# Patient Record
Sex: Male | Born: 1985 | Race: White | Hispanic: No | Marital: Single | Smoking: Never smoker
Health system: Southern US, Community
[De-identification: ages and names within clinical notes are randomized; demographics above are authoritative.]

## PROBLEM LIST (undated history)

## (undated) DIAGNOSIS — N289 Disorder of kidney and ureter, unspecified: Secondary | ICD-10-CM

---

## 2017-02-23 ENCOUNTER — Encounter: Payer: Self-pay | Admitting: Pulmonary Disease

## 2017-02-23 ENCOUNTER — Ambulatory Visit (INDEPENDENT_AMBULATORY_CARE_PROVIDER_SITE_OTHER): Payer: 59 | Admitting: Pulmonary Disease

## 2017-02-23 DIAGNOSIS — G4733 Obstructive sleep apnea (adult) (pediatric): Secondary | ICD-10-CM | POA: Diagnosis not present

## 2017-02-23 NOTE — Patient Instructions (Signed)
Home sleep study 

## 2017-02-23 NOTE — Assessment & Plan Note (Signed)
Given excessive daytime somnolence, narrow pharyngeal exam, witnessed apneas & loud snoring, obstructive sleep apnea is very likely & an overnight polysomnogram will be scheduled as a home study. The pathophysiology of obstructive sleep apnea , it's cardiovascular consequences & modes of treatment including CPAP were discused with the patient in detail & they evidenced understanding.  Pretest probability is high 

## 2017-02-23 NOTE — Addendum Note (Signed)
Addended by: Maurene Capes on: 02/23/2017 01:43 PM   Modules accepted: Orders

## 2017-02-23 NOTE — Progress Notes (Signed)
   Subjective:    Patient ID: Jason Burns, male    DOB: 1985/12/01, 31 y.o.   MRN: [id number]  HPI  31 year old obese man presents for evaluation of sleep-disordered breathing. 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 His boss noticed him getting sleepy on the job and asked him to seek evaluation. Epworth sleepiness score is 14 and he reports sleepiness while sitting and reading, watching TV or sitting inactive especially in the afternoons. He often comes home from work and naps for a few hours. Bedtime is between 11 PM and midnight, he cannot stay asleep continuously and wakes up every 2 hours for unclear reasons, loud snoring has been noted by family members and friends, he is known as "dragging" among his friends. Sleep latency is about 20 minutes, he sleeps on his side with 2 pillows, reports 3-4 nocturnal awakenings including nocturia 8 STEMI gets up and is out of bed at 5:30 AM feeling tired with occasional dryness of mouth. On weekends he stays in bed sometimes as late as 2 PM. There is no history suggestive of cataplexy, sleep paralysis or parasomnias  No bed partner history is available. He has been evaluated by primary care and diabetes was ruled out  No past medical history on file.  No past surgical history on file.  No Known Allergies   Social History   Social History  . Marital status: Single    Spouse name: N/A  . Number of children: N/A  . Years of education: N/A   Occupational History  . Not on file.   Social History Main Topics  . Smoking status: Never Smoker  . Smokeless tobacco: Never Used  . Alcohol use Yes  . Drug use: No  . Sexual activity: Not on file   Other Topics Concern  . Not on file   Social History Narrative  . No narrative on file     Family history of asthma in his mother  Review of Systems Constitutional: negative for anorexia, fevers and sweats  Eyes: negative for irritation, redness and visual  disturbance  Ears, nose, mouth, throat, and face: negative for earaches, epistaxis, nasal congestion and sore throat  Respiratory: negative for cough, dyspnea on exertion, sputum and wheezing  Cardiovascular: negative for chest pain, dyspnea, lower extremity edema, orthopnea, palpitations and syncope  Gastrointestinal: negative for abdominal pain, constipation, diarrhea, melena, nausea and vomiting  Genitourinary:negative for dysuria, frequency and hematuria  Hematologic/lymphatic: negative for bleeding, easy bruising and lymphadenopathy  Musculoskeletal:negative for arthralgias, muscle weakness and stiff joints  Neurological: negative for coordination problems, gait problems, headaches and weakness  Endocrine: negative for diabetic symptoms including polydipsia, polyuria and weight loss     Objective:   Physical Exam  Gen. Pleasant, obese, in no distress ENT -Class II airway, enlarged tonsils, no post nasal drip Neck: No JVD, no thyromegaly, no carotid bruits Lungs: no use of accessory muscles, no dullness to percussion, decreased without rales or rhonchi  Cardiovascular: Rhythm regular, heart sounds  normal, no murmurs or gallops, no peripheral edema Musculoskeletal: No deformities, no cyanosis or clubbing , no tremors       Assessment & Plan:

## 2017-02-28 ENCOUNTER — Telehealth: Payer: Self-pay | Admitting: Pulmonary Disease

## 2017-02-28 NOTE — Telephone Encounter (Signed)
Patient was seen by RA last Thursday in [place]. The order has been placed. PCCs will call the patient to get him scheduled.   Left message for patient to call back.

## 2017-03-01 NOTE — Telephone Encounter (Signed)
Spoke with pt, advised order will be placed. Nothing further is needed. Pt understood.

## 2017-03-08 DIAGNOSIS — G4733 Obstructive sleep apnea (adult) (pediatric): Secondary | ICD-10-CM | POA: Diagnosis not present

## 2017-03-16 ENCOUNTER — Telehealth: Payer: Self-pay | Admitting: Pulmonary Disease

## 2017-03-16 DIAGNOSIS — G4733 Obstructive sleep apnea (adult) (pediatric): Secondary | ICD-10-CM | POA: Diagnosis not present

## 2017-03-16 NOTE — Telephone Encounter (Signed)
Per RA, HST showed severe OSA. Suggests a CPAP titration ASAP as the next step.

## 2017-03-17 ENCOUNTER — Telehealth: Payer: Self-pay | Admitting: Pulmonary Disease

## 2017-03-17 DIAGNOSIS — G4733 Obstructive sleep apnea (adult) (pediatric): Secondary | ICD-10-CM

## 2017-03-17 NOTE — Telephone Encounter (Signed)
Per RA, HST showed severe OSA. Suggests a CPAP titration ASAP as the next step. Called and spoke with pt and he is aware of RA recs and the cpap titration study has been placed. Nothing further is needed.

## 2017-03-20 NOTE — Telephone Encounter (Signed)
Cordelia PenSherry called patient and gave patient appt for 04/27/17 and he was not aware and would like someone to explain this to him, CB is [phone].

## 2017-03-20 NOTE — Telephone Encounter (Signed)
Spoke with pt, explained the CPAP process and why he needed a CPAP titration. Pt understood and CPAP titration ordered. Nothing further is needed.

## 2017-03-20 NOTE — Addendum Note (Signed)
Addended by: Cydney OkAUGUSTIN, Anyla Israelson N on: 03/20/2017 09:44 AM   Modules accepted: Orders

## 2017-03-20 NOTE — Telephone Encounter (Signed)
See 03/17/17 message that is closed.  Jason Burns spoke with the patient only a few minutes ago but he did not remember having conversation with [name].

## 2017-03-21 ENCOUNTER — Other Ambulatory Visit: Payer: Self-pay | Admitting: *Deleted

## 2017-03-21 DIAGNOSIS — G4733 Obstructive sleep apnea (adult) (pediatric): Secondary | ICD-10-CM

## 2017-04-18 ENCOUNTER — Telehealth: Payer: Self-pay

## 2017-04-18 NOTE — Telephone Encounter (Signed)
Left message for patient to call back before placing order for CPAP machine.

## 2017-04-18 NOTE — Telephone Encounter (Signed)
-----   Message from Tobe SosSally E Ottinger sent at 04/05/2017  4:30 PM EST ----- Elvina SidleHey babe will you put these order in for this pt thanks  ----- Message ----- From: Oretha MilchAlva, Rakesh V, MD Sent: 04/04/2017   8:53 AM To: Tobe SosSally E Ottinger, Maurene Capesherina M Hilarie Sinha, CMA  Pl send Rx to DME for AutoCPAP 5-20 cm , mask of choice, humidity DL in 4 wks OV in 5 wks  [facility]   RA    ----- Message ----- From: Tobe Sosttinger, Sally E Sent: 04/03/2017   4:26 PM To: Oretha Milchakesh Alva V, MD  I just spoke to [facility] again and they have denied the cpap study  Do you want to do auto cpap? ----- Message ----- From: Oretha MilchAlva, Rakesh V, MD Sent: 04/03/2017  11:43 AM To: Pincus LargeSally E Ottinger, Bode Pieper M Vimal Derego, CMA  Are they denying?  RA ----- Message ----- From: Tobe Sosttinger, Sally E Sent: 04/03/2017  10:15 AM To: Oretha Milchakesh Alva V, MD, Maurene Capesherina M Keliyah Spillman, CMA  [facility] wants to know if you would consider a auto cpap versus a cpap titration study?

## 2017-04-27 ENCOUNTER — Ambulatory Visit (HOSPITAL_BASED_OUTPATIENT_CLINIC_OR_DEPARTMENT_OTHER): Payer: 59 | Attending: Pulmonary Disease | Admitting: Pulmonary Disease

## 2017-04-27 VITALS — Ht 70.0 in | Wt 330.0 lb

## 2017-04-27 DIAGNOSIS — G4733 Obstructive sleep apnea (adult) (pediatric): Secondary | ICD-10-CM | POA: Diagnosis present

## 2017-04-28 ENCOUNTER — Telehealth: Payer: Self-pay | Admitting: Pulmonary Disease

## 2017-04-28 NOTE — Procedures (Signed)
Patient Name: Jason Burns, Jason Burns Study Date: 04/27/2017 Gender: Male D.O.B: 06-Dec-1985 Age (years): 31 Referring Provider: Cyril Mourningakesh Brooklyne Radke Burns, Jason Burns Height (inches): 70 Interpreting Physician: Jason Burns, Jason Burns Weight (lbs): 330 RPSGT: Jason Burns, Jason Burns BMI: 47 MRN: [id number] Neck Size: 19.00   CLINICAL INFORMATION The patient is referred for a CPAP titration to treat sleep apnea.  Date of  HST: 02/2017 , severe OSA  SLEEP STUDY TECHNIQUE As per the AASM Manual for the Scoring of Sleep and Associated Events v2.3 (April 2016) with a hypopnea requiring 4% desaturations.  The channels recorded and monitored were frontal, central and occipital EEG, electrooculogram (EOG), submentalis EMG (chin), nasal and oral airflow, thoracic and abdominal wall motion, anterior tibialis EMG, snore microphone, electrocardiogram, and pulse oximetry. Continuous positive airway pressure (CPAP) was initiated at the beginning of the study and titrated to treat sleep-disordered breathing.  RESPIRATORY PARAMETERS Optimal PAP Pressure (cm): 17 AHI at Optimal Pressure (/hr): 3.4 Overall Minimal O2 (%): 73.00 Supine % at Optimal Pressure (%): 100 Minimal O2 at Optimal Pressure (%): 88.0     SLEEP ARCHITECTURE The study was initiated at 10:15:15 PM and ended at 5:29:19 AM.  Sleep onset time was 4.3 minutes and the sleep efficiency was 97.9%. The total sleep time was 425.0 minutes.  The patient spent 2.71% of the night in stage N1 sleep, 40.47% in stage N2 sleep, 24.82% in stage N3 and 32.00% in REM.Stage REM latency was 75.5 minutes  Wake after sleep onset was 4.8. Alpha intrusion was absent. Supine sleep was 100.00%.  CARDIAC DATA The 2 lead EKG demonstrated sinus rhythm. The mean heart rate was 80.40 beats per minute. Other EKG findings include: None.   LEG MOVEMENT DATA The total Periodic Limb Movements of Sleep (PLMS) were 0. The PLMS index was 0.00. A PLMS index of <15 is considered normal in  adults.  IMPRESSIONS - The optimal PAP pressure was 17 cm of water. - Central sleep apnea was not noted during this titration (CAI = 2.1/h). - Severe oxygen desaturations were observed during this titration (min O2 = 73.00%). - The patient snored with loud snoring volume during this titration study. - No cardiac abnormalities were observed during this study. - Clinically significant periodic limb movements were not noted during this study. Arousals associated with PLMs were rare.   DIAGNOSIS - Obstructive Sleep Apnea (327.23 [G47.33 ICD-10])   RECOMMENDATIONS - Trial of CPAP therapy on 17 cm H2O with a Medium size Fisher&Paykel Full Face Mask Simplus mask and heated humidification. - Avoid alcohol, sedatives and other CNS depressants that may worsen sleep apnea and disrupt normal sleep architecture. - Sleep hygiene should be reviewed to assess factors that may improve sleep quality. - Weight management and regular exercise should be initiated or continued. - Return to [facility] for re-evaluation after 4 weeks of therapy   Jason Mourningakesh Dariane Natzke Burns Board Certified in Sleep medicine

## 2017-04-28 NOTE — Telephone Encounter (Signed)
Rx for autoCPAP 10-18 cm H2O with a Medium size Fisher[employer]Paykel Full Face Mask Simplus mask and heated humidification. OV in 4 wks with TP/ me

## 2017-05-09 NOTE — Telephone Encounter (Signed)
Left message for patient to call back  

## 2017-05-10 ENCOUNTER — Telehealth: Payer: Self-pay | Admitting: Pulmonary Disease

## 2017-05-10 DIAGNOSIS — G4733 Obstructive sleep apnea (adult) (pediatric): Secondary | ICD-10-CM

## 2017-05-10 NOTE — Telephone Encounter (Signed)
See phone note dated 04/28/17  I have spoken with the pt and ordered CPAP per RA's request  He is aware to call for the ov once he obtains the machine  Nothing further needed

## 2017-05-10 NOTE — Telephone Encounter (Signed)
See phone note dated 05/10/17

## 2017-05-17 ENCOUNTER — Telehealth: Payer: Self-pay | Admitting: Pulmonary Disease

## 2017-05-17 DIAGNOSIS — G4733 Obstructive sleep apnea (adult) (pediatric): Secondary | ICD-10-CM

## 2017-05-17 NOTE — Telephone Encounter (Signed)
.  The order for CPAP was sent on 05/06/2017. He states he would like a printed prescription so he can come by and pick it up.

## 2017-05-18 NOTE — Telephone Encounter (Signed)
Pt advised Rx ready to pick up. Nothing further is needed.

## 2019-10-10 ENCOUNTER — Other Ambulatory Visit: Payer: Self-pay

## 2019-10-10 ENCOUNTER — Encounter (HOSPITAL_BASED_OUTPATIENT_CLINIC_OR_DEPARTMENT_OTHER): Payer: Self-pay | Admitting: *Deleted

## 2019-10-10 ENCOUNTER — Emergency Department (HOSPITAL_BASED_OUTPATIENT_CLINIC_OR_DEPARTMENT_OTHER)
Admission: EM | Admit: 2019-10-10 | Discharge: 2019-10-10 | Disposition: A | Discharge status: Home / Self Care | Payer: 59 | Attending: Emergency Medicine | Admitting: Emergency Medicine

## 2019-10-10 ENCOUNTER — Emergency Department (HOSPITAL_BASED_OUTPATIENT_CLINIC_OR_DEPARTMENT_OTHER): Payer: 59

## 2019-10-10 DIAGNOSIS — K8020 Calculus of gallbladder without cholecystitis without obstruction: Secondary | ICD-10-CM | POA: Diagnosis not present

## 2019-10-10 DIAGNOSIS — R1013 Epigastric pain: Secondary | ICD-10-CM | POA: Diagnosis present

## 2019-10-10 DIAGNOSIS — R112 Nausea with vomiting, unspecified: Secondary | ICD-10-CM | POA: Diagnosis not present

## 2019-10-10 DIAGNOSIS — N289 Disorder of kidney and ureter, unspecified: Secondary | ICD-10-CM | POA: Diagnosis not present

## 2019-10-10 HISTORY — DX: Disorder of kidney and ureter, unspecified: N28.9

## 2019-10-10 LAB — CBC WITH DIFFERENTIAL/PLATELET
Abs Immature Granulocytes: 0.05 10*3/uL (ref 0.00–0.07)
Basophils Absolute: 0 10*3/uL (ref 0.0–0.1)
Basophils Relative: 0 %
Eosinophils Absolute: 0.2 10*3/uL (ref 0.0–0.5)
Eosinophils Relative: 2 %
HCT: 45.3 % (ref 39.0–52.0)
Hemoglobin: 15.4 g/dL (ref 13.0–17.0)
Immature Granulocytes: 1 %
Lymphocytes Relative: 23 %
Lymphs Abs: 2.2 10*3/uL (ref 0.7–4.0)
MCH: 30.3 pg (ref 26.0–34.0)
MCHC: 34 g/dL (ref 30.0–36.0)
MCV: 89.2 fL (ref 80.0–100.0)
Monocytes Absolute: 0.8 10*3/uL (ref 0.1–1.0)
Monocytes Relative: 8 %
Neutro Abs: 6.2 10*3/uL (ref 1.7–7.7)
Neutrophils Relative %: 66 %
Platelets: 179 10*3/uL (ref 150–400)
RBC: 5.08 MIL/uL (ref 4.22–5.81)
RDW: 13.8 % (ref 11.5–15.5)
WBC: 9.4 10*3/uL (ref 4.0–10.5)
nRBC: 0 % (ref 0.0–0.2)

## 2019-10-10 LAB — COMPREHENSIVE METABOLIC PANEL
ALT: 43 U/L (ref 0–44)
AST: 26 U/L (ref 15–41)
Albumin: 4.4 g/dL (ref 3.5–5.0)
Alkaline Phosphatase: 56 U/L (ref 38–126)
Anion gap: 11 (ref 5–15)
BUN: 11 mg/dL (ref 6–20)
CO2: 28 mmol/L (ref 22–32)
Calcium: 9.4 mg/dL (ref 8.9–10.3)
Chloride: 102 mmol/L (ref 98–111)
Creatinine, Ser: 0.76 mg/dL (ref 0.61–1.24)
GFR calc Af Amer: >60 mL/min (ref >60)
GFR calc non Af Amer: >60 mL/min (ref >60)
Glucose, Bld: 147 mg/dL — ABNORMAL HIGH (ref 70–99)
Potassium: 3.9 mmol/L (ref 3.5–5.1)
Sodium: 141 mmol/L (ref 135–145)
Total Bilirubin: 0.7 mg/dL (ref 0.3–1.2)
Total Protein: 8 g/dL (ref 6.5–8.1)

## 2019-10-10 LAB — URINALYSIS, ROUTINE W REFLEX MICROSCOPIC
Bilirubin Urine: NEGATIVE
Glucose, UA: NEGATIVE mg/dL
Ketones, ur: NEGATIVE mg/dL
Leukocytes,Ua: NEGATIVE
Nitrite: NEGATIVE
Protein, ur: NEGATIVE mg/dL
Specific Gravity, Urine: >1.03 — ABNORMAL HIGH (ref 1.005–1.030)
pH: 6 (ref 5.0–8.0)

## 2019-10-10 LAB — LIPASE, BLOOD: Lipase: 36 U/L (ref 11–51)

## 2019-10-10 LAB — URINALYSIS, MICROSCOPIC (REFLEX)

## 2019-10-10 MED ORDER — ONDANSETRON 8 MG PO TBDP: 8.0000 mg | ORAL_TABLET | Freq: Three times a day (TID) | ORAL | 0 refills | Status: AC | PRN

## 2019-10-10 MED ORDER — SUCRALFATE 1 GM/10ML PO SUSP
1.0000 g | Freq: Once | ORAL | Status: AC
  Administered 2019-10-10: 1 g via ORAL
  Filled 2019-10-10: qty 10

## 2019-10-10 MED ORDER — IOHEXOL 300 MG/ML  SOLN
100.0000 mL | Freq: Once | INTRAMUSCULAR | Status: AC | PRN
  Administered 2019-10-10: 100 mL via INTRAVENOUS

## 2019-10-10 MED ORDER — PANTOPRAZOLE SODIUM 40 MG IV SOLR
40.0000 mg | Freq: Once | INTRAVENOUS | Status: AC
  Administered 2019-10-10: 40 mg via INTRAVENOUS
  Filled 2019-10-10: qty 40

## 2019-10-10 MED ORDER — SUCRALFATE 1 G PO TABS
1.0000 g | ORAL_TABLET | Freq: Once | ORAL | Status: DC
  Filled 2019-10-10: qty 1

## 2019-10-10 MED ORDER — OMEPRAZOLE 20 MG PO CPDR: 20.0000 mg | DELAYED_RELEASE_CAPSULE | Freq: Every day | ORAL | 0 refills | Status: AC

## 2019-10-10 MED ORDER — ONDANSETRON HCL 4 MG/2ML IJ SOLN
4.0000 mg | Freq: Once | INTRAMUSCULAR | Status: AC
  Administered 2019-10-10: 4 mg via INTRAVENOUS
  Filled 2019-10-10: qty 2

## 2019-10-10 MED ORDER — SODIUM CHLORIDE 0.9 % IV BOLUS
1000.0000 mL | Freq: Once | INTRAVENOUS | Status: AC
  Administered 2019-10-10: 1000 mL via INTRAVENOUS

## 2019-10-10 MED ORDER — FENTANYL CITRATE (PF) 100 MCG/2ML IJ SOLN
100.0000 ug | Freq: Once | INTRAMUSCULAR | Status: AC
  Administered 2019-10-10: 100 ug via INTRAVENOUS
  Filled 2019-10-10: qty 2

## 2019-10-10 NOTE — ED Triage Notes (Signed)
Pt c/o epigastric pain and vomited x 2 episodes  x 1 day

## 2019-10-10 NOTE — ED Provider Notes (Addendum)
He rates  [facility] Provider Note: Lowella Dell, MD, FACEP  CSN: [id number] MRN: [id number] ARRIVAL: 10/10/19 at 0105 ROOM: [facility]   CHIEF COMPLAINT  Abdominal Pain   HISTORY OF PRESENT ILLNESS  10/10/19 4:50 AM Jason Burns is a 33 y.o. male who complains of epigastric pain since about 10 PM yesterday evening.  The pain is a 7 out of 10 ("2 notches down from a kidney stone") with associated nausea and vomiting.  Pain is worse with palpation.  He has had no bowel movement since this began.  He has not had a fever.  The pain is sharp in nature.   Past Medical History:  Diagnosis Date  . Renal disorder     History reviewed. No pertinent surgical history.  No family history on file.  Social History   Tobacco Use  . Smoking status: Never Smoker  . Smokeless tobacco: Never Used  Substance Use Topics  . Alcohol use: Yes  . Drug use: No    Prior to Admission medications   Medication Sig Start Date End Date Taking? Authorizing Provider  omeprazole (PRILOSEC) 20 MG capsule Take 1 capsule (20 mg total) by mouth daily. 10/10/19   Mariyam Remington, Jonny Ruiz, MD  ondansetron (ZOFRAN ODT) 8 MG disintegrating tablet Take 1 tablet (8 mg total) by mouth every 8 (eight) hours as needed for nausea or vomiting. 10/10/19   Braxtyn Bojarski, Jonny Ruiz, MD    Allergies Patient has no known allergies.   REVIEW OF SYSTEMS  Negative except as noted here or in the History of Present Illness.   PHYSICAL EXAMINATION  Initial Vital Signs Blood pressure 139/84, pulse 65, temperature 98.6 F (37 C), temperature source Oral, resp. rate 18, height 5\' 10"  (1.778 m), SpO2 100 %.  Examination General: Well-developed, obese male in no acute distress; appearance consistent with age of record HENT: normocephalic; atraumatic Eyes: pupils equal, round and reactive to light; extraocular muscles intact Neck: supple Heart: regular rate and rhythm Lungs: clear to auscultation bilaterally Abdomen: soft;  nondistended; epigastric tenderness; bowel sounds present Extremities: No deformity; full range of motion; edema of lower legs Neurologic: Awake, alert and oriented; motor function intact in all extremities and symmetric; no facial droop Skin: Warm and dry Psychiatric: Normal mood and affect   RESULTS  Summary of this visit's results, reviewed and interpreted by myself:   EKG Interpretation  Date/Time:    Ventricular Rate:    PR Interval:    QRS Duration:   QT Interval:    QTC Calculation:   R Axis:     Text Interpretation:        Laboratory Studies: Results for orders placed or performed during the hospital encounter of 10/10/19 (from the past 24 hour(s))  CBC with Differential     Status: None   Collection Time: 10/10/19  1:20 AM  Result Value Ref Range   WBC 9.4 4.0 - 10.5 K/uL   RBC 5.08 4.22 - 5.81 MIL/uL   Hemoglobin 15.4 13.0 - 17.0 g/dL   HCT 10/12/19 79.0 - 24.0 %   MCV 89.2 80.0 - 100.0 fL   MCH 30.3 26.0 - 34.0 pg   MCHC 34.0 30.0 - 36.0 g/dL   RDW 97.3 53.2 - 99.2 %   Platelets 179 150 - 400 K/uL   nRBC 0.0 0.0 - 0.2 %   Neutrophils Relative % 66 %   Neutro Abs 6.2 1.7 - 7.7 K/uL   Lymphocytes Relative 23 %   Lymphs Abs 2.2 0.7 -  4.0 K/uL   Monocytes Relative 8 %   Monocytes Absolute 0.8 0.1 - 1.0 K/uL   Eosinophils Relative 2 %   Eosinophils Absolute 0.2 0.0 - 0.5 K/uL   Basophils Relative 0 %   Basophils Absolute 0.0 0.0 - 0.1 K/uL   Immature Granulocytes 1 %   Abs Immature Granulocytes 0.05 0.00 - 0.07 K/uL  Comprehensive metabolic panel     Status: Abnormal   Collection Time: 10/10/19  1:20 AM  Result Value Ref Range   Sodium 141 135 - 145 mmol/L   Potassium 3.9 3.5 - 5.1 mmol/L   Chloride 102 98 - 111 mmol/L   CO2 28 22 - 32 mmol/L   Glucose, Bld 147 (H) 70 - 99 mg/dL   BUN 11 6 - 20 mg/dL   Creatinine, Ser 0.76 0.61 - 1.24 mg/dL   Calcium 9.4 8.9 - 10.3 mg/dL   Total Protein 8.0 6.5 - 8.1 g/dL   Albumin 4.4 3.5 - 5.0 g/dL   AST 26 15 - 41  U/L   ALT 43 0 - 44 U/L   Alkaline Phosphatase 56 38 - 126 U/L   Total Bilirubin 0.7 0.3 - 1.2 mg/dL   GFR calc non Af Amer >60 >60 mL/min   GFR calc Af Amer >60 >60 mL/min   Anion gap 11 5 - 15  Lipase, blood     Status: None   Collection Time: 10/10/19  1:20 AM  Result Value Ref Range   Lipase 36 11 - 51 U/L  Urinalysis, Routine w reflex microscopic     Status: Abnormal   Collection Time: 10/10/19  2:30 AM  Result Value Ref Range   Color, Urine YELLOW YELLOW   APPearance HAZY (A) CLEAR   Specific Gravity, Urine >1.030 (H) 1.005 - 1.030   pH 6.0 5.0 - 8.0   Glucose, UA NEGATIVE NEGATIVE mg/dL   Hgb urine dipstick MODERATE (A) NEGATIVE   Bilirubin Urine NEGATIVE NEGATIVE   Ketones, ur NEGATIVE NEGATIVE mg/dL   Protein, ur NEGATIVE NEGATIVE mg/dL   Nitrite NEGATIVE NEGATIVE   Leukocytes,Ua NEGATIVE NEGATIVE  Urinalysis, Microscopic (reflex)     Status: Abnormal   Collection Time: 10/10/19  2:30 AM  Result Value Ref Range   RBC / HPF 0-5 0 - 5 RBC/hpf   WBC, UA 6-10 0 - 5 WBC/hpf   Bacteria, UA FEW (A) NONE SEEN   Squamous Epithelial / LPF 0-5 0 - 5   WBC Clumps PRESENT    Imaging Studies: CT ABDOMEN PELVIS W CONTRAST  Result Date: 10/10/2019 CLINICAL DATA:  Epigastric abdominal pain. EXAM: CT ABDOMEN AND PELVIS WITH CONTRAST TECHNIQUE: Multidetector CT imaging of the abdomen and pelvis was performed using the standard protocol following bolus administration of intravenous contrast. CONTRAST:  140mL OMNIPAQUE IOHEXOL 300 MG/ML  SOLN COMPARISON:  None. FINDINGS: Lower chest: The lung bases are clear of acute process. No pleural effusion or pulmonary lesions. The heart is normal in size. No pericardial effusion. The distal esophagus and aorta are unremarkable. Hepatobiliary: No hepatic lesions are identified. Suspect mild diffuse fatty infiltration of the liver. The portal and hepatic veins are patent. Several small dependent calcified gallstones are noted the gallbladder. No  findings for acute cholecystitis. No common bile duct dilatation. Pancreas: No mass, inflammation or ductal dilatation. Spleen: Upper limits of normal in size. No focal lesions. Adrenals/Urinary Tract: The adrenal glands and kidneys are normal. No renal, ureteral or bladder calculi or mass. Stomach/Bowel: The stomach, duodenum, small bowel  and colon are grossly normal without oral contrast. No acute inflammatory changes, mass lesions or obstructive findings. The terminal ileum is normal. The appendix is unremarkable. Vascular/Lymphatic: The aorta is normal in caliber. No dissection. The branch vessels are patent. The major venous structures are patent. No mesenteric or retroperitoneal mass or adenopathy. Small scattered lymph nodes are noted. Reproductive: The prostate gland and seminal vesicles are unremarkable. Other: No pelvic mass or adenopathy. No free pelvic fluid collections. No inguinal mass or adenopathy. No abdominal wall hernia or subcutaneous lesions. Musculoskeletal: No significant bony findings. IMPRESSION: 1. No acute abdominal/pelvic findings, mass lesions or adenopathy. 2. Cholelithiasis but no CT findings for acute cholecystitis. 3. Suspect mild diffuse fatty infiltration of the liver. Electronically Signed   By: Rudie Meyer M.D.   On: 10/10/2019 06:01    ED COURSE and MDM  Nursing notes, initial and subsequent vitals signs, including pulse oximetry, reviewed and interpreted by myself.  Vitals:   10/10/19 0111 10/10/19 0113 10/10/19 0328 10/10/19 0631  BP:  (!) 151/100 139/84 (!) 149/84  Pulse:  75 65 81  Resp:  18 18 16   Temp:  98.6 F (37 C)    TempSrc:  Oral    SpO2:  100% 100% 100%  Height: 5\' 10"  (1.778 m)      Medications  sucralfate (CARAFATE) tablet 1 g (has no administration in time range)  sodium chloride 0.9 % bolus 1,000 mL (1,000 mLs Intravenous New Bag/Given 10/10/19 0508)  ondansetron (ZOFRAN) injection 4 mg (4 mg Intravenous Given 10/10/19 0508)  pantoprazole  (PROTONIX) injection 40 mg (40 mg Intravenous Given 10/10/19 0513)  fentaNYL (SUBLIMAZE) injection 100 mcg (100 mcg Intravenous Given 10/10/19 0513)  iohexol (OMNIPAQUE) 300 MG/ML solution 100 mL (100 mLs Intravenous Contrast Given 10/10/19 0534)   6:28 AM Patient's abdomen is less tender at this time and his pain has migrated more to the left.  His pain and nausea are improved.  I suspect this represents acute gastritis possibly of viral etiology.  He was advised of his numerous gallstones but his pain was located medial to his gallbladder and he had a negative Murphy sign.  He was advised he may wish to follow-up with general surgery regarding elective cholecystectomy in the future.   PROCEDURES  Procedures   ED DIAGNOSES     ICD-10-CM   1. Epigastric pain  R10.13   2. Nausea and vomiting in adult  R11.2   3. Calculus of gallbladder without cholecystitis without obstruction  K80.20        Orell Hurtado, MD 10/10/19 10/12/19    10/12/19, MD 10/10/19 2237

## 2020-12-07 IMAGING — CT CT ABD-PELV W/ CM
2 of 4 series · 16 of 46 positions shown, 18 images · IV contrast (Omnipaque)
Comparison: None.

CLINICAL DATA: Epigastric abdominal pain.

EXAM:
CT ABDOMEN AND PELVIS WITH CONTRAST
TECHNIQUE: Multidetector CT imaging of the abdomen and pelvis was performed
using the standard protocol following bolus administration of
intravenous contrast.
CONTRAST:  100mL OMNIPAQUE IOHEXOL 300 MG/ML  SOLN

[Series 2: axial st · axial · 0.98mm/px · z∈[-540,-30]mm · 13 of 112 slices shown, 15 images]
[im 5/112  soft-tissue]
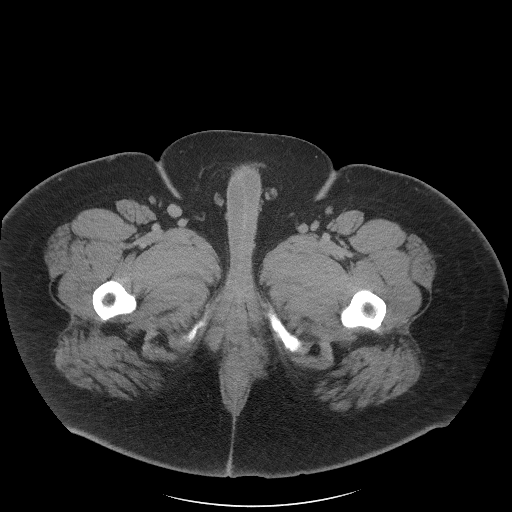
[im 5/112  bone]
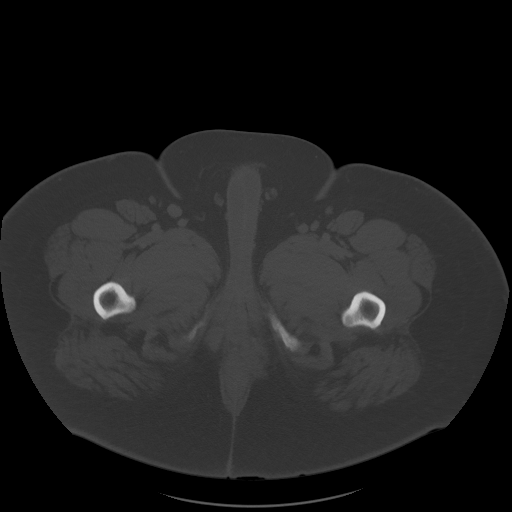
[im 15/112  soft-tissue]
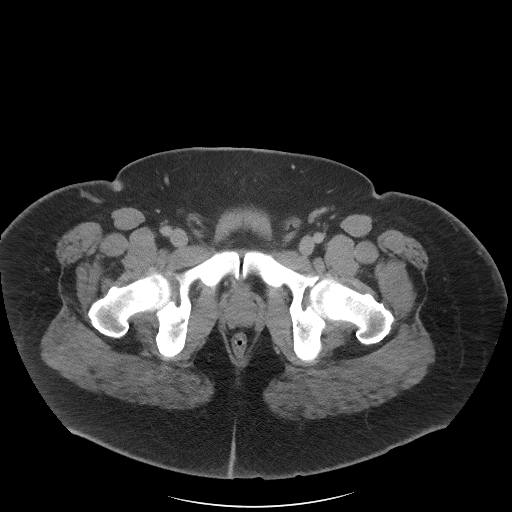
[im 25/112  soft-tissue]
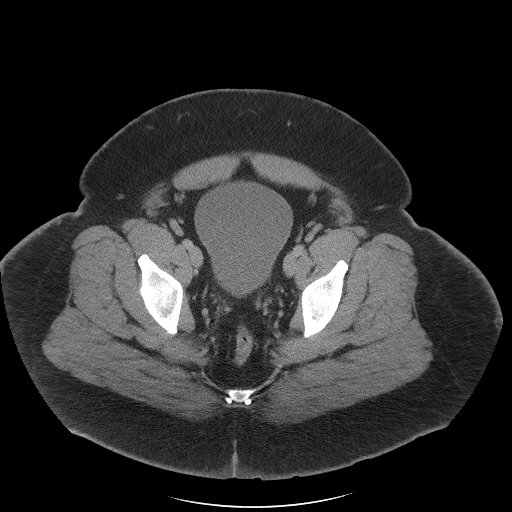
[im 29/112  soft-tissue]
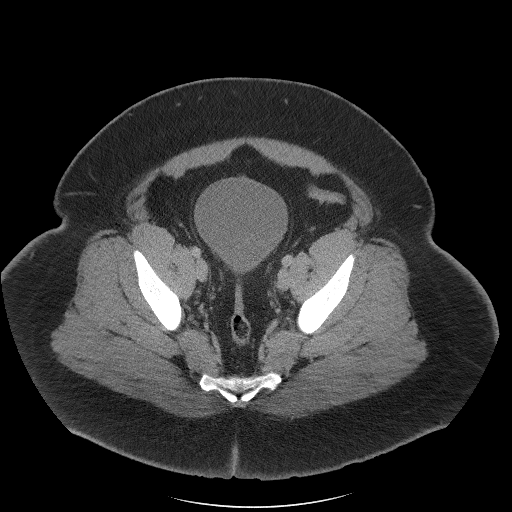
[im 39/112  soft-tissue]
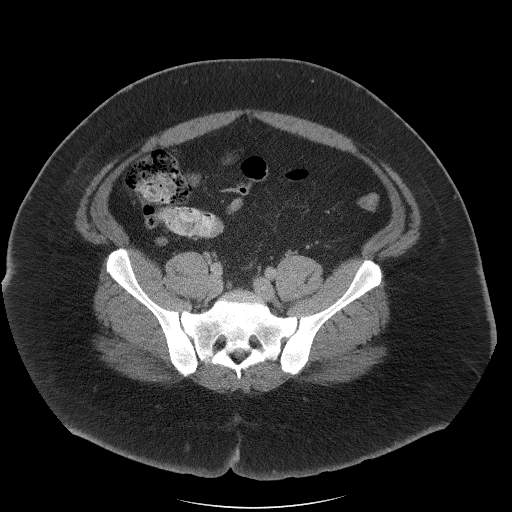
[im 49/112  soft-tissue]
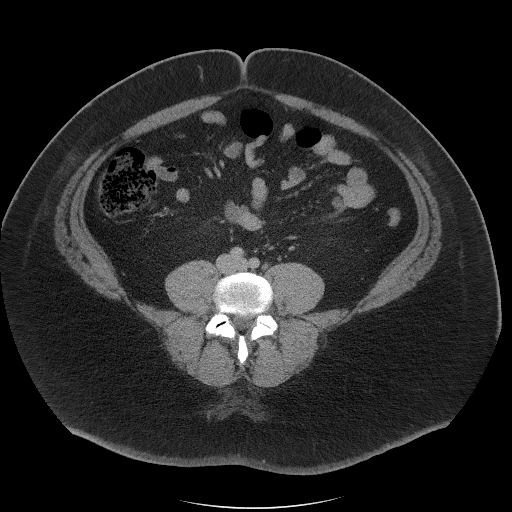
[im 58/112  soft-tissue]
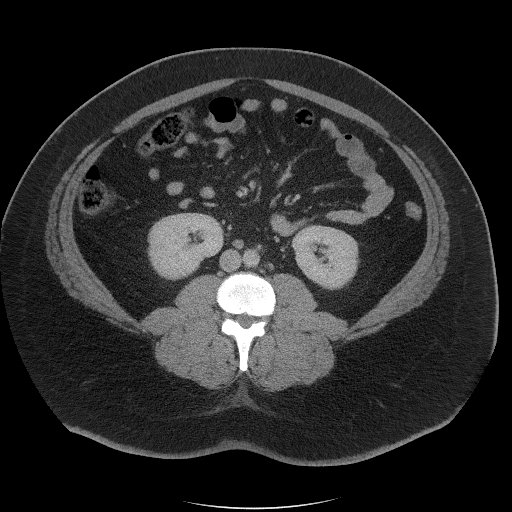
[im 63/112  soft-tissue]
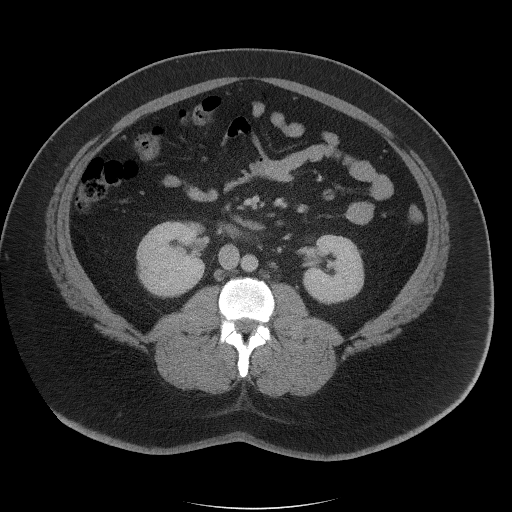
[im 73/112  soft-tissue]
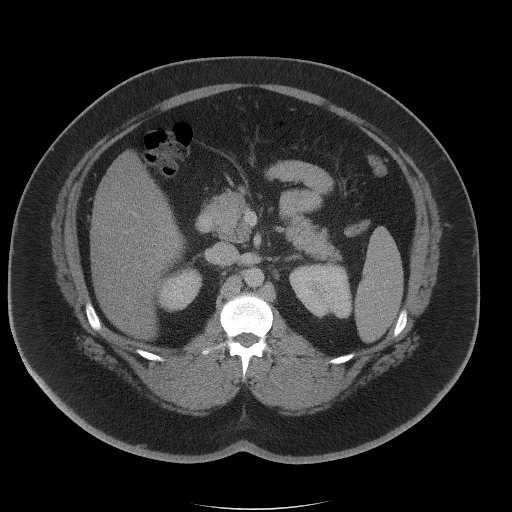
[im 73/112  bone]
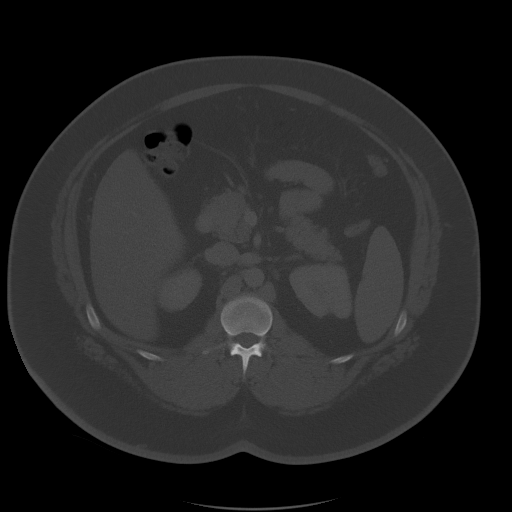
[im 83/112  soft-tissue]
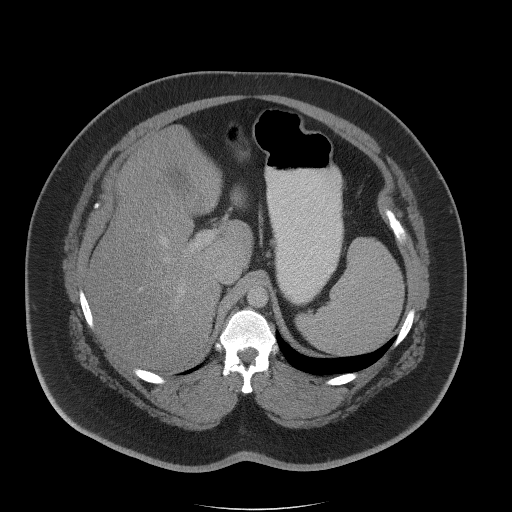
[im 87/112  soft-tissue]
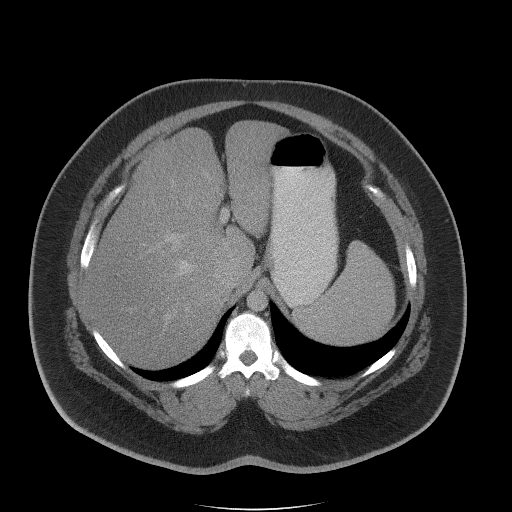
[im 97/112  soft-tissue]
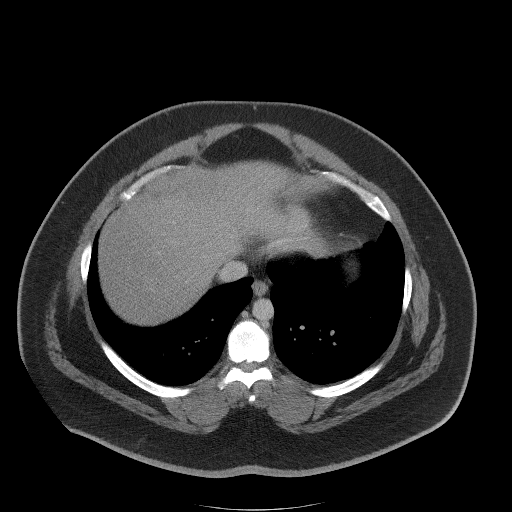
[im 107/112  soft-tissue]
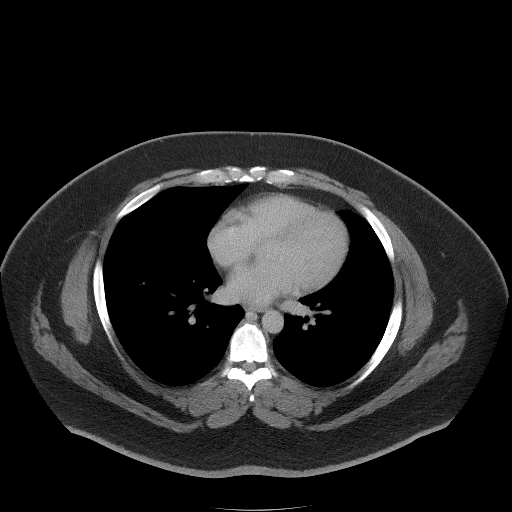

[Series 5: coronal st · coronal · 1.00mm/px · 3 of 118 slices shown]
[im 40/118  soft-tissue]
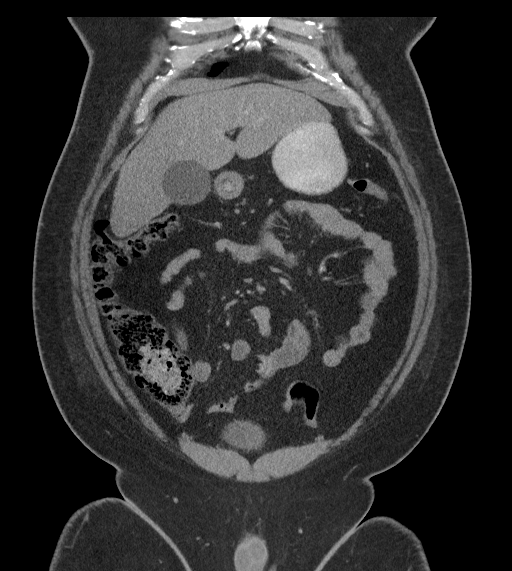
[im 53/118  soft-tissue]
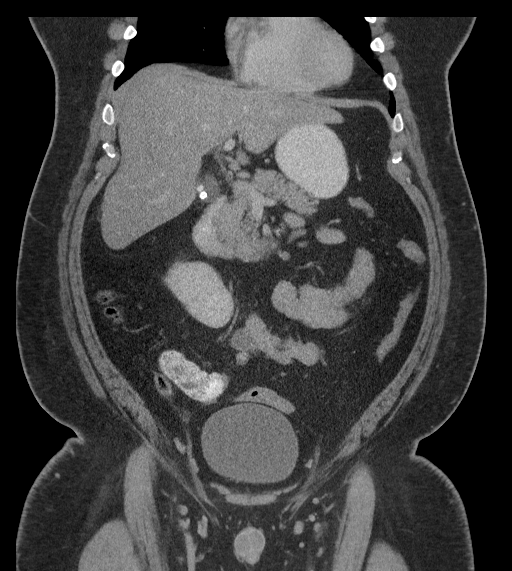
[im 66/118  soft-tissue]
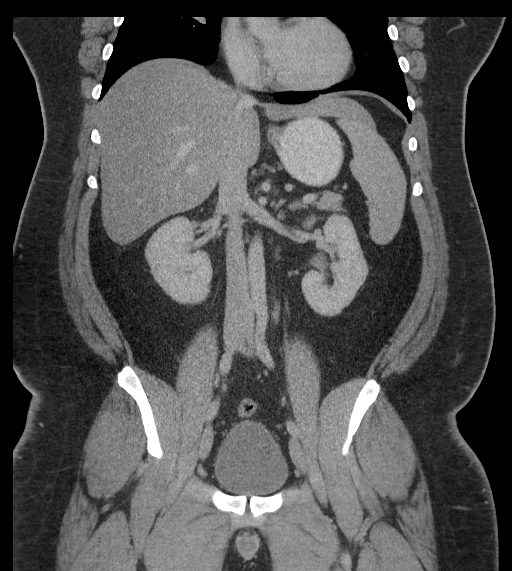

[16 of 46 positions shown; findings below may reference images not displayed]

FINDINGS: Lower chest: The lung bases are clear of acute process. No pleural
effusion or pulmonary lesions. The heart is normal in size. No
pericardial effusion. The distal esophagus and aorta are
unremarkable.

Hepatobiliary: No hepatic lesions are identified. Suspect mild
diffuse fatty infiltration of the liver. The portal and hepatic
veins are patent. Several small dependent calcified gallstones are
noted the gallbladder. No findings for acute cholecystitis. No
common bile duct dilatation.

Pancreas: No mass, inflammation or ductal dilatation.

Spleen: Upper limits of normal in size. No focal lesions.

Adrenals/Urinary Tract: The adrenal glands and kidneys are normal.
No renal, ureteral or bladder calculi or mass.

Stomach/Bowel: The stomach, duodenum, small bowel and colon are
grossly normal without oral contrast. No acute inflammatory changes,
mass lesions or obstructive findings. The terminal ileum is normal.
The appendix is unremarkable.

Vascular/Lymphatic: The aorta is normal in caliber. No dissection.
The branch vessels are patent. The major venous structures are
patent. No mesenteric or retroperitoneal mass or adenopathy. Small
scattered lymph nodes are noted.

Reproductive: The prostate gland and seminal vesicles are
unremarkable.

Other: No pelvic mass or adenopathy. No free pelvic fluid
collections. No inguinal mass or adenopathy. No abdominal wall
hernia or subcutaneous lesions.

Musculoskeletal: No significant bony findings.
IMPRESSION: 1. No acute abdominal/pelvic findings, mass lesions or adenopathy.
2. Cholelithiasis but no CT findings for acute cholecystitis.
3. Suspect mild diffuse fatty infiltration of the liver.
